# Patient Record
Sex: Female | Born: 1990 | Race: White | Hispanic: No | Marital: Single | Smoking: Never smoker
Health system: Southern US, Community
[De-identification: ages and names within clinical notes are randomized; demographics above are authoritative.]

## PROBLEM LIST (undated history)

## (undated) DIAGNOSIS — N83202 Unspecified ovarian cyst, left side: Secondary | ICD-10-CM

## (undated) DIAGNOSIS — K219 Gastro-esophageal reflux disease without esophagitis: Secondary | ICD-10-CM

## (undated) DIAGNOSIS — J189 Pneumonia, unspecified organism: Secondary | ICD-10-CM

## (undated) DIAGNOSIS — I10 Essential (primary) hypertension: Secondary | ICD-10-CM

## (undated) DIAGNOSIS — N83201 Unspecified ovarian cyst, right side: Secondary | ICD-10-CM

## (undated) DIAGNOSIS — M1990 Unspecified osteoarthritis, unspecified site: Secondary | ICD-10-CM

## (undated) HISTORY — PX: OTHER SURGICAL HISTORY: SHX169

---
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
## 2014-04-25 ENCOUNTER — Encounter (HOSPITAL_COMMUNITY): Payer: Self-pay

## 2014-04-25 ENCOUNTER — Emergency Department (HOSPITAL_COMMUNITY)
Admission: EM | Admit: 2014-04-25 | Discharge: 2014-04-26 | Disposition: A | Discharge status: Home / Self Care | Payer: 59 | Attending: Emergency Medicine | Admitting: Emergency Medicine

## 2014-04-25 DIAGNOSIS — N938 Other specified abnormal uterine and vaginal bleeding: Secondary | ICD-10-CM | POA: Diagnosis not present

## 2014-04-25 DIAGNOSIS — R109 Unspecified abdominal pain: Secondary | ICD-10-CM

## 2014-04-25 DIAGNOSIS — Z3202 Encounter for pregnancy test, result negative: Secondary | ICD-10-CM | POA: Insufficient documentation

## 2014-04-25 DIAGNOSIS — B9689 Other specified bacterial agents as the cause of diseases classified elsewhere: Secondary | ICD-10-CM

## 2014-04-25 DIAGNOSIS — N760 Acute vaginitis: Secondary | ICD-10-CM | POA: Diagnosis not present

## 2014-04-25 HISTORY — DX: Unspecified ovarian cyst, left side: N83.202

## 2014-04-25 HISTORY — DX: Unspecified ovarian cyst, right side: N83.201

## 2014-04-25 LAB — CBC WITH DIFFERENTIAL/PLATELET
Basophils Absolute: 0 10*3/uL (ref 0.0–0.1)
Basophils Relative: 0 % (ref 0–1)
Eosinophils Absolute: 0.1 10*3/uL (ref 0.0–0.7)
Eosinophils Relative: 1 % (ref 0–5)
HCT: 44.2 % (ref 36.0–46.0)
Hemoglobin: 15 g/dL (ref 12.0–15.0)
LYMPHS ABS: 3.4 10*3/uL (ref 0.7–4.0)
Lymphocytes Relative: 40 % (ref 12–46)
MCH: 30.3 pg (ref 26.0–34.0)
MCHC: 33.9 g/dL (ref 30.0–36.0)
MCV: 89.3 fL (ref 78.0–100.0)
Monocytes Absolute: 0.8 10*3/uL (ref 0.1–1.0)
Monocytes Relative: 9 % (ref 3–12)
NEUTROS ABS: 4.1 10*3/uL (ref 1.7–7.7)
Neutrophils Relative %: 50 % (ref 43–77)
Platelets: 286 10*3/uL (ref 150–400)
RBC: 4.95 MIL/uL (ref 3.87–5.11)
RDW: 12.4 % (ref 11.5–15.5)
WBC: 8.4 10*3/uL (ref 4.0–10.5)

## 2014-04-25 LAB — URINALYSIS, ROUTINE W REFLEX MICROSCOPIC
Bilirubin Urine: NEGATIVE
GLUCOSE, UA: NEGATIVE mg/dL
KETONES UR: NEGATIVE mg/dL
Leukocytes, UA: NEGATIVE
Nitrite: NEGATIVE
PH: 7 (ref 5.0–8.0)
PROTEIN: NEGATIVE mg/dL
Specific Gravity, Urine: 1.01 (ref 1.005–1.030)
Urobilinogen, UA: 0.2 mg/dL (ref 0.0–1.0)

## 2014-04-25 LAB — URINE MICROSCOPIC-ADD ON

## 2014-04-25 LAB — PREGNANCY, URINE: PREG TEST UR: NEGATIVE

## 2014-04-25 NOTE — ED Provider Notes (Signed)
CSN: [id number]     Arrival date & time 04/25/14  2144 History   First MD Initiated Contact with Patient 04/25/14 2300     Chief Complaint  Patient presents with  . Abdominal Pain     (Consider location/radiation/quality/duration/timing/severity/associated sxs/prior Treatment) HPI  Patient reports she's been having pain in her right flank and right mid abdomen off and on for the past 2 weeks. When she gets the pain it will last several days and go away. She describes the pain as sharp. She denies nausea, vomiting, diarrhea, or tissue area. She does have frequency but denies fever. She states movement makes the pain worse. She states laying down makes the pain feel better. She also reports her last normal period was about 2 weeks ago however she started bleeding about 5 days ago. She is on birth control pills to control ovarian cysts. She denies being sexually active.  PCP Dr Lysbeth Galas  Past Medical History  Diagnosis Date  . Bilateral ovarian cysts    History reviewed. No pertinent past surgical history. History reviewed. No pertinent family history. History  Substance Use Topics  . Smoking status: Never Smoker   . Smokeless tobacco: Not on file  . Alcohol Use: Yes     Comment: occasionally    employed  OB History    No data available     Review of Systems  All other systems reviewed and are negative.     Allergies  Review of patient's allergies indicates no known allergies.  Home Medications   Prior to Admission medications   Medication Sig Start Date End Date Taking? Authorizing Provider  cephALEXin (KEFLEX) 500 MG capsule Take 1 capsule (500 mg total) by mouth 3 (three) times daily. 04/26/14   Devoria Albe, MD  metroNIDAZOLE (FLAGYL) 500 MG tablet Take 1 tablet (500 mg total) by mouth 2 (two) times daily. 04/26/14   Devoria Albe, MD  naproxen (NAPROSYN) 500 MG tablet Take 1 po BID with food prn pain 04/26/14   Devoria Albe, MD   BP 151/100 mmHg  Pulse 62  Temp(Src) 99.2 F (37.3  C) (Oral)  Resp 22  Ht  (1.727 m)  Wt 180 lb (81.647 kg)  BMI 27.38 kg/m2  SpO2 100%  LMP  (LMP Unknown)  Vital signs normal   Physical Exam  Constitutional: She is oriented to person, place, and time. She appears well-developed and well-nourished.  Non-toxic appearance. She does not appear ill. No distress.  HENT:  Head: Normocephalic and atraumatic.  Right Ear: External ear normal.  Left Ear: External ear normal.  Nose: Nose normal. No mucosal edema or rhinorrhea.  Mouth/Throat: Oropharynx is clear and moist and mucous membranes are normal. No dental abscesses or uvula swelling.  Eyes: Conjunctivae and EOM are normal. Pupils are equal, round, and reactive to light.  Neck: Normal range of motion and full passive range of motion without pain. Neck supple.  Cardiovascular: Normal rate, regular rhythm and normal heart sounds.  Exam reveals no gallop and no friction rub.   No murmur heard. Pulmonary/Chest: Effort normal and breath sounds normal. No respiratory distress. She has no wheezes. She has no rhonchi. She has no rales. She exhibits no tenderness and no crepitus.  Abdominal: Soft. Normal appearance and bowel sounds are normal. She exhibits no distension. There is tenderness. There is no rebound and no guarding.    Area of pain noted  Genitourinary:  No CVAT  There is no active bleeding in the vaginal vault. There  is a small amount of white/brown discharge seen. Her uterus is normal size and nontender, her adnexa are normal size without masses and are nontender.  Musculoskeletal: Normal range of motion. She exhibits no edema or tenderness.       Back:  Moves all extremities well.  Area of pain noted, but no CVAT  Neurological: She is alert and oriented to person, place, and time. She has normal strength. No cranial nerve deficit.  Skin: Skin is warm, dry and intact. No rash noted. No erythema. No pallor.  Psychiatric: She has a normal mood and affect. Her speech is  normal and behavior is normal. Her mood appears not anxious.  Nursing note and vitals reviewed.   ED Course  Procedures (including critical care time) Medications - No data to display  We discussed patient's wet prep results. Patient was given results of her CT scan. Due to the possibility of pyelonephritis or cystitis seen on her CT scan without having a significant changes in her urine she was given a additional antibiotic, her BV will be treated with Flagyl and her urinary tract infection be treated with Keflex. [facility].   Labs Review Results for orders placed or performed during the hospital encounter of 04/25/14  Wet prep, genital  Result Value Ref Range   Yeast Wet Prep HPF POC NONE SEEN NONE SEEN   Trich, Wet Prep NONE SEEN NONE SEEN   Clue Cells Wet Prep HPF POC FEW (A) NONE SEEN   WBC, Wet Prep HPF POC FEW (A) NONE SEEN  Urinalysis, Routine w reflex microscopic  Result Value Ref Range   Color, Urine YELLOW YELLOW   APPearance HAZY (A) CLEAR   Specific Gravity, Urine 1.010 1.005 - 1.030   pH 7.0 5.0 - 8.0   Glucose, UA NEGATIVE NEGATIVE mg/dL   Hgb urine dipstick LARGE (A) NEGATIVE   Bilirubin Urine NEGATIVE NEGATIVE   Ketones, ur NEGATIVE NEGATIVE mg/dL   Protein, ur NEGATIVE NEGATIVE mg/dL   Urobilinogen, UA 0.2 0.0 - 1.0 mg/dL   Nitrite NEGATIVE NEGATIVE   Leukocytes, UA NEGATIVE NEGATIVE  Pregnancy, urine  Result Value Ref Range   Preg Test, Ur NEGATIVE NEGATIVE  CBC with Differential  Result Value Ref Range   WBC 8.4 4.0 - 10.5 K/uL   RBC 4.95 3.87 - 5.11 MIL/uL   Hemoglobin 15.0 12.0 - 15.0 g/dL   HCT 13.0 86.5 - 78.4 %   MCV 89.3 78.0 - 100.0 fL   MCH 30.3 26.0 - 34.0 pg   MCHC 33.9 30.0 - 36.0 g/dL   RDW 69.6 29.5 - 28.4 %   Platelets 286 150 - 400 K/uL   Neutrophils Relative % 50 43 - 77 %   Neutro Abs 4.1 1.7 - 7.7 K/uL   Lymphocytes Relative 40 12 - 46 %   Lymphs Abs 3.4 0.7 -  4.0 K/uL   Monocytes Relative 9 3 - 12 %   Monocytes Absolute 0.8 0.1 - 1.0 K/uL   Eosinophils Relative 1 0 - 5 %   Eosinophils Absolute 0.1 0.0 - 0.7 K/uL   Basophils Relative 0 0 - 1 %   Basophils Absolute 0.0 0.0 - 0.1 K/uL  Comprehensive metabolic panel  Result Value Ref Range   Sodium 138 135 - 145 mmol/L   Potassium 3.2 (L) 3.5 - 5.1 mmol/L   Chloride 105 96 - 112 mmol/L   CO2 24 19 - 32 mmol/L  Glucose, Bld 96 70 - 99 mg/dL   BUN 9 6 - 23 mg/dL   Creatinine, Ser 4.09 0.50 - 1.10 mg/dL   Calcium 9.0 8.4 - 81.1 mg/dL   Total Protein 8.2 6.0 - 8.3 g/dL   Albumin 4.3 3.5 - 5.2 g/dL   AST 23 0 - 37 U/L   ALT 27 0 - 35 U/L   Alkaline Phosphatase 72 39 - 117 U/L   Total Bilirubin 0.3 0.3 - 1.2 mg/dL   GFR calc non Af Amer >90 >90 mL/min   GFR calc Af Amer >90 >90 mL/min   Anion gap 9 5 - 15  Urinalysis, Routine w reflex microscopic  Result Value Ref Range   Color, Urine YELLOW YELLOW   APPearance CLEAR CLEAR   Specific Gravity, Urine 1.010 1.005 - 1.030   pH 7.5 5.0 - 8.0   Glucose, UA NEGATIVE NEGATIVE mg/dL   Hgb urine dipstick NEGATIVE NEGATIVE   Bilirubin Urine NEGATIVE NEGATIVE   Ketones, ur NEGATIVE NEGATIVE mg/dL   Protein, ur NEGATIVE NEGATIVE mg/dL   Urobilinogen, UA 0.2 0.0 - 1.0 mg/dL   Nitrite NEGATIVE NEGATIVE   Leukocytes, UA NEGATIVE NEGATIVE  Urine microscopic-add on  Result Value Ref Range   Squamous Epithelial / LPF FEW (A) RARE   WBC, UA 0-2 <3 WBC/hpf   RBC / HPF 3-6 <3 RBC/hpf   Bacteria, UA FEW (A) RARE   Laboratory interpretation all normal except hematuria (voided urine while on menses), bv     Imaging Review Ct Renal Stone Study  04/26/2014   CLINICAL DATA:  Sharp intermittent right flank and side pain for 2 weeks. Vaginal bleeding for 5 days. History of ovarian cysts.  EXAM: CT ABDOMEN AND PELVIS WITHOUT CONTRAST  TECHNIQUE: Multidetector CT imaging of the abdomen and pelvis was performed following the standard protocol without IV  contrast.  COMPARISON:  01/29/2013  FINDINGS: The lung bases are clear.  The right kidney demonstrates heterogeneous appearance of the mid and lower pole with loss of renal sinus fat. This is difficult to characterize in the absence of IV contrast material. Appearance could be due to focal pyelonephritis or mass lesion. Correlation is recommended for urinary tract infection. Consider followup after resolution of acute process to exclude underlying renal lesion. No renal, ureteral, or bladder stones identified. No evidence of hydronephrosis or hydroureter. The bladder wall appears mildly thickened and there is gas within the bladder. In the absence of instrumentation, this suggests infection.  The unenhanced appearance of the liver, spleen, gallbladder, pancreas, adrenal glands, kidneys, abdominal aorta, inferior vena cava, and retroperitoneal lymph nodes is unremarkable. Stomach, small bowel, and colon are decompressed. No free air or free fluid in the abdomen.  Pelvis: Uterus and ovaries are not enlarged. No free or loculated pelvic fluid collections. No pelvic mass or lymphadenopathy. The appendix is not identified. No destructive bone lesions.  IMPRESSION: No renal or ureteral stone or obstruction. Bladder wall thickening and gas in the bladder suggest cystitis. Nonspecific heterogeneous appearance of the right kidney is difficult to characterize without IV contrast material. This may represent pyelonephritis particularly in the setting of cystitis. However, underlying mass lesion is not excluded and followup after resolution of acute process is suggested.   Electronically Signed   By: Burman Nieves M.D.   On: 04/26/2014 03:50     EKG Interpretation None      MDM   Final diagnoses:  Right flank pain  DUB (dysfunctional uterine bleeding)  BV (bacterial vaginosis)  Discharge Medication List as of 04/26/2014  4:20 AM    START taking these medications   Details  cephALEXin (KEFLEX) 500 MG  capsule Take 1 capsule (500 mg total) by mouth 3 (three) times daily., Starting 04/26/2014, Until Discontinued, Print    metroNIDAZOLE (FLAGYL) 500 MG tablet Take 1 tablet (500 mg total) by mouth 2 (two) times daily., Starting 04/26/2014, Until Discontinued, Print    naproxen (NAPROSYN) 500 MG tablet Take 1 po BID with food prn pain, Print        Plan discharge  Devoria AlbeIva Terria Deschepper, MD, [facility]oFACEP     Tee Richeson, MD 04/26/14 [phone]

## 2014-04-25 NOTE — ED Notes (Signed)
Patient states right flank/side pain X2 weeks with vaginal bleeding X5 days. Patient has a history of ovarian cysts

## 2014-04-26 ENCOUNTER — Emergency Department (HOSPITAL_COMMUNITY): Payer: 59

## 2014-04-26 LAB — COMPREHENSIVE METABOLIC PANEL
ALBUMIN: 4.3 g/dL (ref 3.5–5.2)
ALK PHOS: 72 U/L (ref 39–117)
ALT: 27 U/L (ref 0–35)
AST: 23 U/L (ref 0–37)
Anion gap: 9 (ref 5–15)
BUN: 9 mg/dL (ref 6–23)
CO2: 24 mmol/L (ref 19–32)
Calcium: 9 mg/dL (ref 8.4–10.5)
Chloride: 105 mmol/L (ref 96–112)
Creatinine, Ser: 0.7 mg/dL (ref 0.50–1.10)
GFR calc Af Amer: >90 mL/min (ref >90)
GFR calc non Af Amer: >90 mL/min (ref >90)
Glucose, Bld: 96 mg/dL (ref 70–99)
POTASSIUM: 3.2 mmol/L — AB (ref 3.5–5.1)
Sodium: 138 mmol/L (ref 135–145)
TOTAL PROTEIN: 8.2 g/dL (ref 6.0–8.3)
Total Bilirubin: 0.3 mg/dL (ref 0.3–1.2)

## 2014-04-26 LAB — URINALYSIS, ROUTINE W REFLEX MICROSCOPIC
BILIRUBIN URINE: NEGATIVE
GLUCOSE, UA: NEGATIVE mg/dL
HGB URINE DIPSTICK: NEGATIVE
Ketones, ur: NEGATIVE mg/dL
Leukocytes, UA: NEGATIVE
Nitrite: NEGATIVE
PH: 7.5 (ref 5.0–8.0)
Protein, ur: NEGATIVE mg/dL
Specific Gravity, Urine: 1.01 (ref 1.005–1.030)
Urobilinogen, UA: 0.2 mg/dL (ref 0.0–1.0)

## 2014-04-26 LAB — WET PREP, GENITAL
Trich, Wet Prep: NONE SEEN
YEAST WET PREP: NONE SEEN

## 2014-04-26 MED ORDER — NAPROXEN 500 MG PO TABS: ORAL_TABLET | ORAL | Status: DC

## 2014-04-26 MED ORDER — CEPHALEXIN 500 MG PO CAPS: 500.0000 mg | ORAL_CAPSULE | Freq: Three times a day (TID) | ORAL | Status: DC

## 2014-04-26 MED ORDER — METRONIDAZOLE 500 MG PO TABS: 500.0000 mg | ORAL_TABLET | Freq: Two times a day (BID) | ORAL | Status: DC

## 2014-04-26 NOTE — Discharge Instructions (Signed)
Drink plenty of fluids. Take the medications as prescribed. [facility] bleeding if it doesn't stop. You can take Aleve 2 tabs OTC twice a day for pain OR ibuprofen 600 mg 4 times a day for pain.   Return to the ED if you get a fever, have uncontrolled vomiting or severe pain.     Bacterial Vaginosis Bacterial vaginosis is a vaginal infection that occurs when the normal balance of bacteria in the vagina is disrupted. It results from an overgrowth of certain bacteria. This is the most common vaginal infection in women of childbearing age. Treatment is important to prevent complications, especially in pregnant women, as it can cause a premature delivery. CAUSES  Bacterial vaginosis is caused by an increase in harmful bacteria that are normally present in smaller amounts in the vagina. Several different kinds of bacteria can cause bacterial vaginosis. However, the reason that the condition develops is not fully understood. RISK FACTORS Certain activities or behaviors can put you at an increased risk of developing bacterial vaginosis, including:  Having a new sex partner or multiple sex partners.  Douching.  Using an intrauterine device (IUD) for contraception. Women do not get bacterial vaginosis from toilet seats, bedding, swimming pools, or contact with objects around them. SIGNS AND SYMPTOMS  Some women with bacterial vaginosis have no signs or symptoms. Common symptoms include:  Grey vaginal discharge.  A fishlike odor with discharge, especially after sexual intercourse.  Itching or burning of the vagina and vulva.  Burning or pain with urination. DIAGNOSIS  Your health care provider will take a medical history and examine the vagina for signs of bacterial vaginosis. A sample of vaginal fluid may be taken. Your health care provider will look at this sample under a microscope to check for bacteria and abnormal cells. A vaginal pH test  may also be done.  TREATMENT  Bacterial vaginosis may be treated with antibiotic medicines. These may be given in the form of a pill or a vaginal cream. A second round of antibiotics may be prescribed if the condition comes back after treatment.  HOME CARE INSTRUCTIONS   Only take over-the-counter or prescription medicines as directed by your health care provider.  If antibiotic medicine was prescribed, take it as directed. Make sure you finish it even if you start to feel better.  Do not have sex until treatment is completed.  Tell all sexual partners that you have a vaginal infection. They should see their health care provider and be treated if they have problems, such as a mild rash or itching.  Practice safe sex by using condoms and only having one sex partner. SEEK MEDICAL CARE IF:   Your symptoms are not improving after 3 days of treatment.  You have increased discharge or pain.  You have a fever. MAKE SURE YOU:   Understand these instructions.  Will watch your condition.  Will get help right away if you are not doing well or get worse. FOR MORE INFORMATION  Centers for Disease Control and Prevention, Division of STD Prevention: SolutionApps.co.zawww.cdc.gov/std American Sexual Health Association (ASHA): www.ashastd.org  Document Released: 01/08/2005 Document Revised: 10/29/2012 Document Reviewed: 08/20/2012 [facility] Patient Information 2015 [place], [place]. This information is not intended to replace advice given to you by your health care provider. Make sure you discuss any questions you have with your health care provider.

## 2014-04-27 LAB — URINE CULTURE
Colony Count: NO GROWTH
Culture: NO GROWTH

## 2014-04-27 LAB — GC/CHLAMYDIA PROBE AMP (~~LOC~~) NOT AT ARMC
Chlamydia: NEGATIVE
NEISSERIA GONORRHEA: NEGATIVE

## 2014-04-27 LAB — RPR: RPR Ser Ql: NONREACTIVE

## 2016-03-23 IMAGING — CT CT RENAL STONE PROTOCOL
3 of 4 series · 10 of 46 positions shown, 15 images · non-contrast
Comparison: 01/29/2013

CLINICAL DATA: Sharp intermittent right flank and side pain for 2
weeks. Vaginal bleeding for 5 days. History of ovarian cysts.

EXAM:
CT ABDOMEN AND PELVIS WITHOUT CONTRAST
TECHNIQUE: Multidetector CT imaging of the abdomen and pelvis was performed
following the standard protocol without IV contrast.

[Series 2: mpr coronal (id) · coronal · 0.64mm/px · 3 of 90 slices shown]
[im 30/90  soft-tissue]
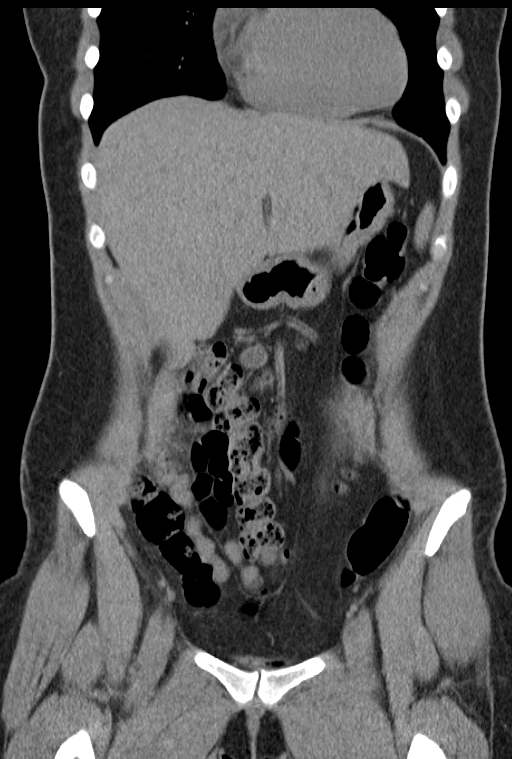
[im 40/90  soft-tissue]
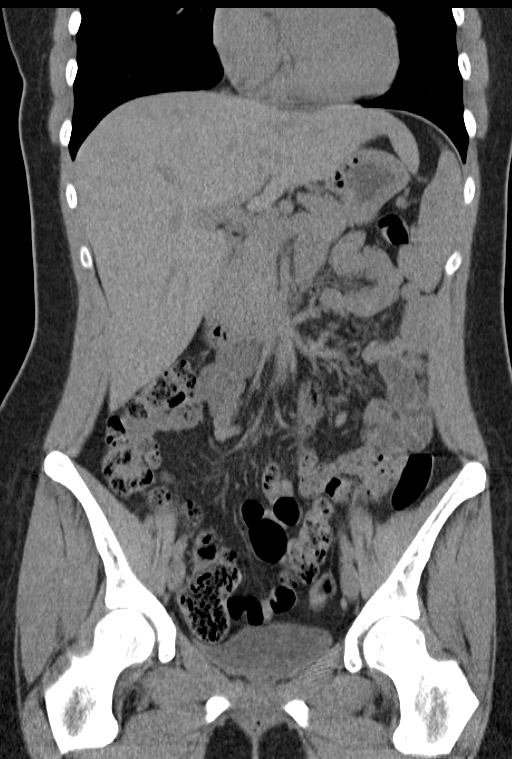
[im 50/90  soft-tissue]
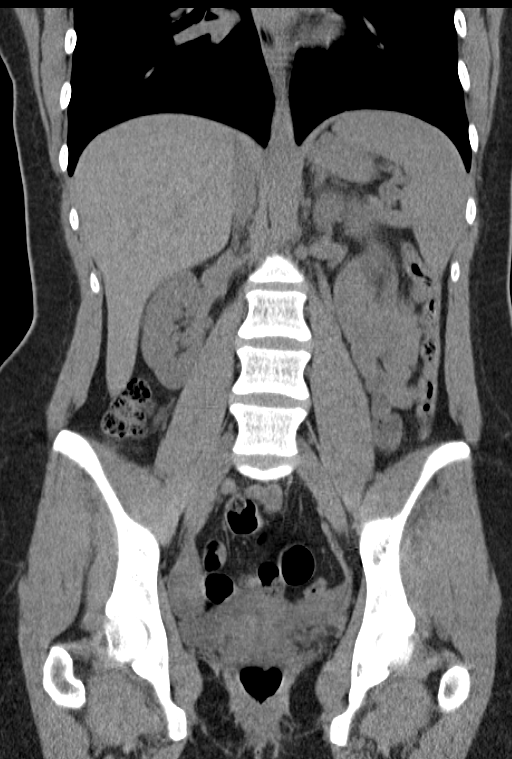

[Series 4: mpr sagittal (id) · sagittal · 0.62mm/px · 1 of 110 slices shown]
[im 37/110  soft-tissue]
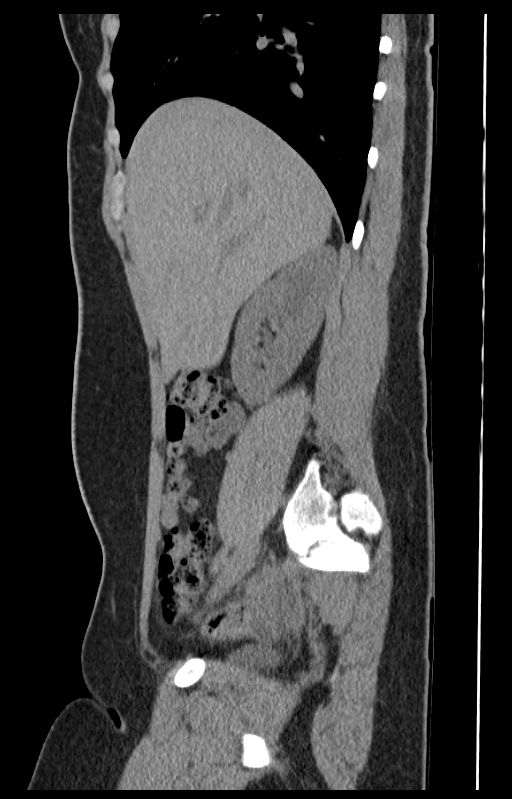

[Series 6: lung 5.0 b60f · axial · 0.59mm/px · z∈[-576,-462]mm · 6 of 33 slices shown, 11 images]
[im 5/33  soft-tissue]
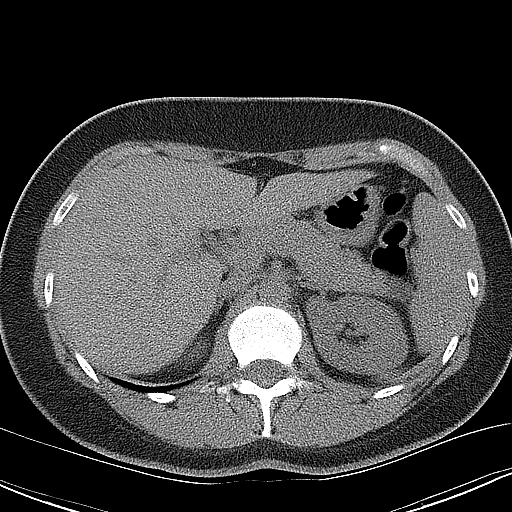
[im 5/33  bone]
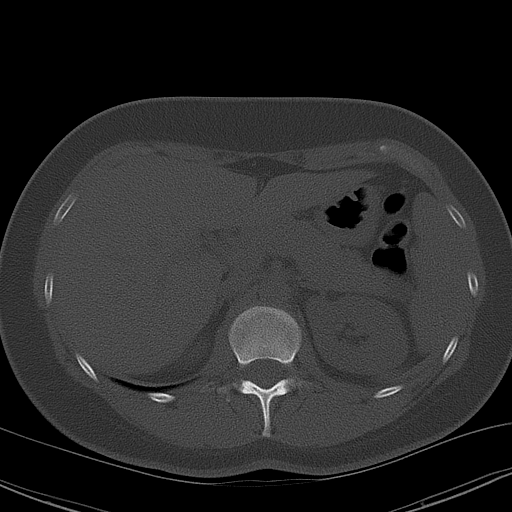
[im 10/33  soft-tissue]
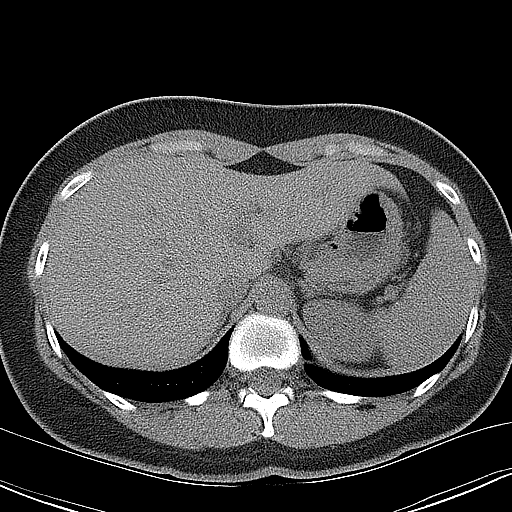
[im 14/33  soft-tissue]
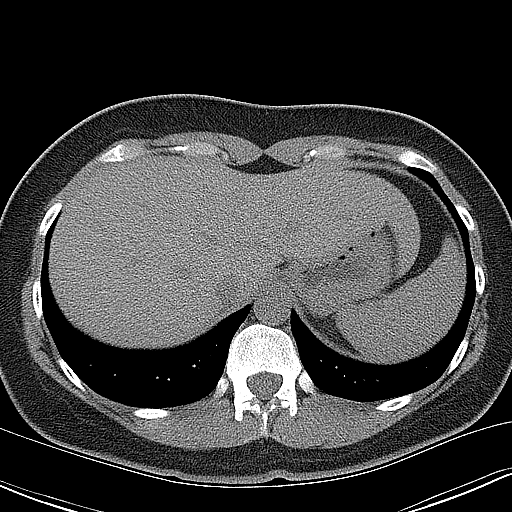
[im 14/33  lung]
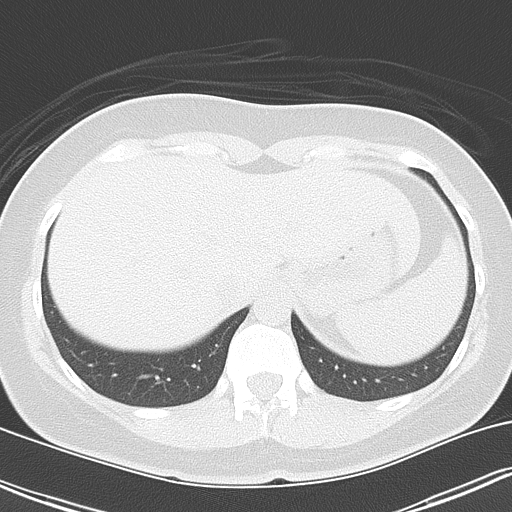
[im 19/33  soft-tissue]
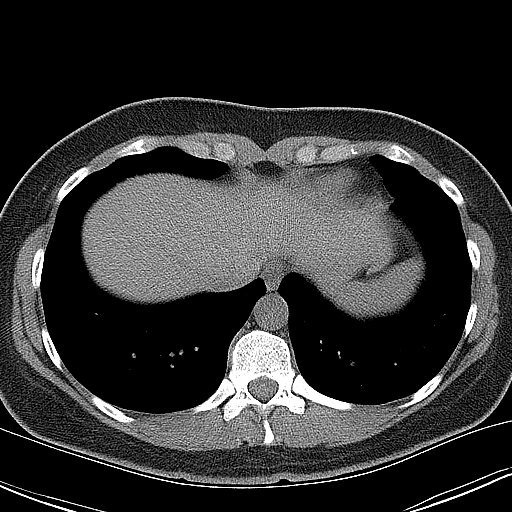
[im 19/33  lung]
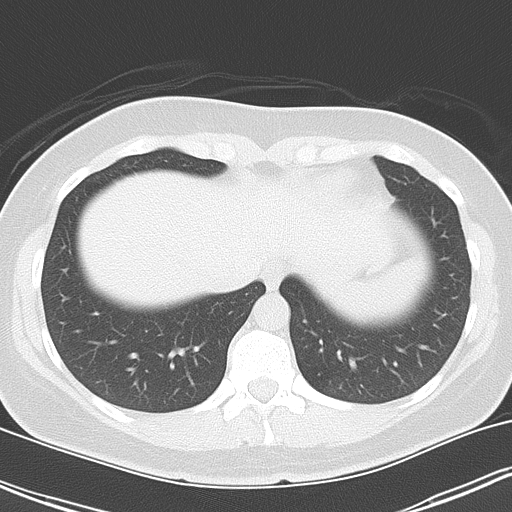
[im 23/33  soft-tissue]
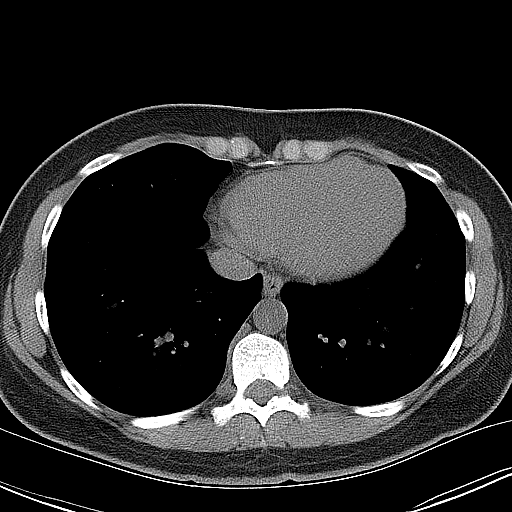
[im 23/33  lung]
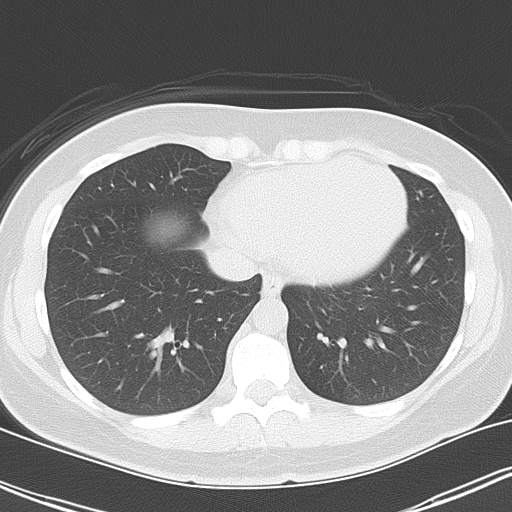
[im 28/33  soft-tissue]
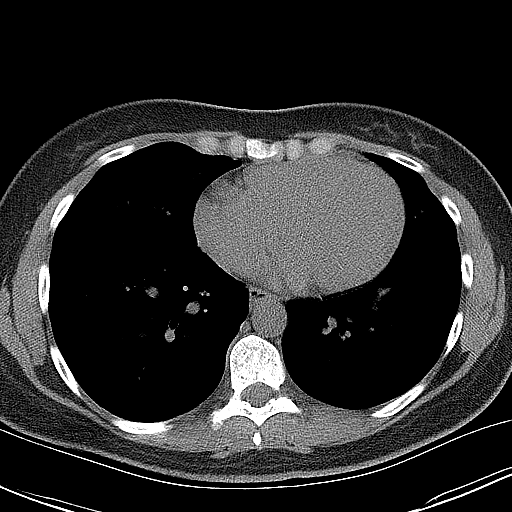
[im 28/33  lung]
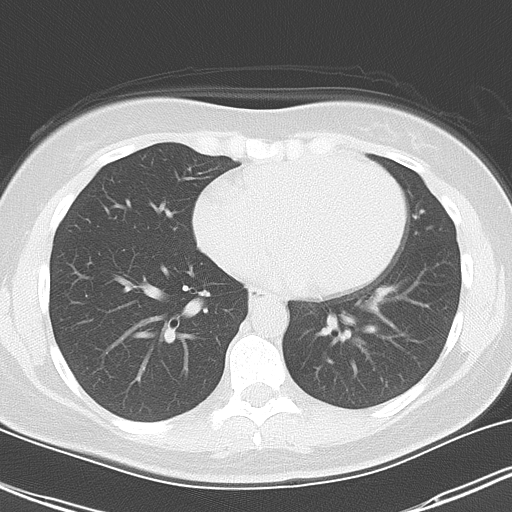

[10 of 46 positions shown; findings below may reference images not displayed]

FINDINGS: The lung bases are clear.

The right kidney demonstrates heterogeneous appearance of the mid
and lower pole with loss of renal sinus fat. This is difficult to
characterize in the absence of IV contrast material. Appearance
could be due to focal pyelonephritis or mass lesion. Correlation is
recommended for urinary tract infection. Consider followup after
resolution of acute process to exclude underlying renal lesion. No
renal, ureteral, or bladder stones identified. No evidence of
hydronephrosis or hydroureter. The bladder wall appears mildly
thickened and there is gas within the bladder. In the absence of
instrumentation, this suggests infection.

The unenhanced appearance of the liver, spleen, gallbladder,
pancreas, adrenal glands, kidneys, abdominal aorta, inferior vena
cava, and retroperitoneal lymph nodes is unremarkable. Stomach,
small bowel, and colon are decompressed. No free air or free fluid
in the abdomen.

Pelvis: Uterus and ovaries are not enlarged. No free or loculated
pelvic fluid collections. No pelvic mass or lymphadenopathy. The
appendix is not identified. No destructive bone lesions.
IMPRESSION: No renal or ureteral stone or obstruction. Bladder wall thickening
and gas in the bladder suggest cystitis. Nonspecific heterogeneous
appearance of the right kidney is difficult to characterize without
IV contrast material. This may represent pyelonephritis particularly
in the setting of cystitis. However, underlying mass lesion is not
excluded and followup after resolution of acute process is
suggested.

## 2023-04-02 ENCOUNTER — Ambulatory Visit: Payer: Self-pay | Admitting: Surgery

## 2023-04-02 NOTE — H&P (Signed)
 Michelle Pineda [id number]   Referring Provider: Rebecka Apley, NP  Subjective   Chief Complaint: New Consultation   History of Present Illness: 33 year old woman history of ovarian cysts, IBS, hypertension, GERD, lumbar radiculopathy, scapula pain and cervicalgia who was referred for a subcutaneous cyst of the scalp. This has been treated with Keflex and ketoconazole shampoo per PCP notes from 2 weeks ago is about 3 mm in size. Patient notes there are 3 lesions, 1 of which seems to have grown somewhat in size and is becoming more tender. No prior procedures in the area and has not noted any drainage.  Review of Systems: A complete review of systems was obtained from the patient. I have reviewed this information and discussed as appropriate with the patient. See HPI as well for other ROS.  Medical History: Past Medical History:  Diagnosis Date  Arthritis  GERD (gastroesophageal reflux disease)  Hypertension   There is no problem list on file for this patient.  History reviewed. No pertinent surgical history.   No Known Allergies  Current Outpatient Medications on File Prior to Visit  Medication Sig Dispense Refill  amLODIPine (NORVASC) 10 MG tablet Take 10 mg by mouth once daily  gabapentin (NEURONTIN) 300 MG capsule 1 PM x 1week then, 2 PM x 1 week then, 1 AM + 2PM x 1week, then 2 AM + 2PM  loratadine (CLARITIN) 10 mg tablet Take 10 mg by mouth once daily  losartan (COZAAR) 50 MG tablet Take 50 mg by mouth once daily  meloxicam (MOBIC) 15 MG tablet Take 15 mg by mouth once daily  methocarbamoL (ROBAXIN) 500 MG tablet Take 500 mg by mouth  montelukast (SINGULAIR) 10 mg tablet Take 10 mg by mouth at bedtime  naproxen sodium (ALEVE) 220 mg Cap Take 220 mg by mouth every 12 (twelve) hours   No current facility-administered medications on file prior to visit.   Family History  Problem Relation Age of Onset  Deep vein thrombosis (DVT or abnormal blood clot formation)  Mother  Stroke Father  Diabetes Father    Social History   Tobacco Use  Smoking Status Never  Smokeless Tobacco Never    Social History   Socioeconomic History  Marital status: Single  Tobacco Use  Smoking status: Never  Smokeless tobacco: Never  Substance and Sexual Activity  Alcohol use: Never  Drug use: Never   Social Drivers of Corporate investment banker Strain: Medium Risk (02/19/2023)  Received from [facility]  Overall Financial Resource Strain (CARDIA)  Difficulty of Paying Living Expenses: Somewhat hard  Food Insecurity: No Food Insecurity (02/19/2023)  Received from [facility]  Hunger Vital Sign  Worried About Running Out of Food in the Last Year: Never true  Ran Out of Food in the Last Year: Never true  Transportation Needs: No Transportation Needs (02/19/2023)  [facility] - [facility]  Lack of Transportation (Medical): No  Lack of Transportation (Non-Medical): No  Physical Activity: Sufficiently Active (02/19/2023)  Received from [facility]  Exercise Vital Sign  Days of Exercise per Week: 7 days  Minutes of Exercise per Session: 150+ min  Stress: No Stress Concern Present (02/19/2023)  [facility] from [facility] of Occupational [facility] - Occupational Stress Questionnaire  Feeling of Stress : Not at all  Social Connections: Moderately Integrated (02/19/2023)  Received from [facility]  Social Network  How would you rate your social network (family, work, friends)?: Adequate participation with social networks  Housing Stability: Unknown (03/29/2023)  Housing Stability Vital Sign  Homeless in the Last Year: No   Objective:   Vitals:  03/29/23 1052  BP: 122/80  Pulse: 97  Temp: 36.9 C (98.5 F)  SpO2: 99%  Weight: (!) 115.9 kg (255 lb 9.6 oz)  Height: 172.7 cm (5\' 8" )  PainSc: 0-No pain  PainLoc: Head - Temporal   Body mass index is 38.86 kg/m.  Gen: A&Ox3, no distress  Unlabored  respirations 3 small subcutaneous cysts located on the occiput. To the right of midline is an approximately 8 mm smooth mobile subcutaneous cyst without overlying skin change. To the left of midline is a similar lesion about 3 mm in size, and then just inferior to this 1 is another 3 mm cyst.  Assessment and Plan:  Diagnoses and all orders for this visit:  Subcutaneous mass  Patient desires excision. We discussed the procedure including risks of bleeding, infection, pain, scarring, poor wound healing or undesired cosmetic result, recurrence of the lesions or development of other lesions in other parts of the scalp. Questions welcomed and answered to her satisfaction. She would like to investigate cost prior to scheduling but is interested in proceeding with removal.  [name], MD

## 2023-04-02 NOTE — H&P (View-Only) (Signed)
 Michelle Pineda [id number]   Referring Provider: Rebecka Apley, NP  Subjective   Chief Complaint: New Consultation   History of Present Illness: 33 year old woman history of ovarian cysts, IBS, hypertension, GERD, lumbar radiculopathy, scapula pain and cervicalgia who was referred for a subcutaneous cyst of the scalp. This has been treated with Keflex and ketoconazole shampoo per PCP notes from 2 weeks ago is about 3 mm in size. Patient notes there are 3 lesions, 1 of which seems to have grown somewhat in size and is becoming more tender. No prior procedures in the area and has not noted any drainage.  Review of Systems: A complete review of systems was obtained from the patient. I have reviewed this information and discussed as appropriate with the patient. See HPI as well for other ROS.  Medical History: Past Medical History:  Diagnosis Date  Arthritis  GERD (gastroesophageal reflux disease)  Hypertension   There is no problem list on file for this patient.  History reviewed. No pertinent surgical history.   No Known Allergies  Current Outpatient Medications on File Prior to Visit  Medication Sig Dispense Refill  amLODIPine (NORVASC) 10 MG tablet Take 10 mg by mouth once daily  gabapentin (NEURONTIN) 300 MG capsule 1 PM x 1week then, 2 PM x 1 week then, 1 AM + 2PM x 1week, then 2 AM + 2PM  loratadine (CLARITIN) 10 mg tablet Take 10 mg by mouth once daily  losartan (COZAAR) 50 MG tablet Take 50 mg by mouth once daily  meloxicam (MOBIC) 15 MG tablet Take 15 mg by mouth once daily  methocarbamoL (ROBAXIN) 500 MG tablet Take 500 mg by mouth  montelukast (SINGULAIR) 10 mg tablet Take 10 mg by mouth at bedtime  naproxen sodium (ALEVE) 220 mg Cap Take 220 mg by mouth every 12 (twelve) hours   No current facility-administered medications on file prior to visit.   Family History  Problem Relation Age of Onset  Deep vein thrombosis (DVT or abnormal blood clot formation)  Mother  Stroke Father  Diabetes Father    Social History   Tobacco Use  Smoking Status Never  Smokeless Tobacco Never    Social History   Socioeconomic History  Marital status: Single  Tobacco Use  Smoking status: Never  Smokeless tobacco: Never  Substance and Sexual Activity  Alcohol use: Never  Drug use: Never   Social Drivers of Corporate investment banker Strain: Medium Risk (02/19/2023)  Received from [facility]  Overall Financial Resource Strain (CARDIA)  Difficulty of Paying Living Expenses: Somewhat hard  Food Insecurity: No Food Insecurity (02/19/2023)  Received from [facility]  Hunger Vital Sign  Worried About Running Out of Food in the Last Year: Never true  Ran Out of Food in the Last Year: Never true  Transportation Needs: No Transportation Needs (02/19/2023)  Received from [facility] - Transportation  Lack of Transportation (Medical): No  Lack of Transportation (Non-Medical): No  Physical Activity: Sufficiently Active (02/19/2023)  [facility]  Exercise Vital Sign  Days of Exercise per Week: 7 days  Minutes of Exercise per Session: 150+ min  Stress: No Stress Concern Present (02/19/2023)  Received from [facility] of Occupational Health - Occupational Stress Questionnaire  Feeling of Stress : Not at all  Social Connections: Moderately Integrated (02/19/2023)  Received from [facility]  Social Network  How would you rate your social network (family, work, friends)?: Adequate participation with social networks  Housing Stability: Unknown (03/29/2023)  Housing Stability Vital Sign  Homeless in the Last Year: No   Objective:   Vitals:  03/29/23 1052  BP: 122/80  Pulse: 97  Temp: 36.9 C (98.5 F)  SpO2: 99%  Weight: (!) 115.9 kg (255 lb 9.6 oz)  Height: 172.7 cm (5\' 8" )  PainSc: 0-No pain  PainLoc: Head - Temporal   Body mass index is 38.86 kg/m.  Gen: A&Ox3, no distress  Unlabored  respirations 3 small subcutaneous cysts located on the occiput. To the right of midline is an approximately 8 mm smooth mobile subcutaneous cyst without overlying skin change. To the left of midline is a similar lesion about 3 mm in size, and then just inferior to this 1 is another 3 mm cyst.  Assessment and Plan:  Diagnoses and all orders for this visit:  Subcutaneous mass  Patient desires excision. We discussed the procedure including risks of bleeding, infection, pain, scarring, poor wound healing or undesired cosmetic result, recurrence of the lesions or development of other lesions in other parts of the scalp. Questions welcomed and answered to her satisfaction. She would like to investigate cost prior to scheduling but is interested in proceeding with removal.  [name], MD

## 2023-04-08 NOTE — Patient Instructions (Addendum)
 SURGICAL WAITING ROOM VISITATION  Patients having surgery or a procedure may have no more than 2 support people in the waiting area - these visitors may rotate.    Children under the age of 69 must have an adult with them who is not the patient.  Due to an increase in RSV and influenza rates and associated hospitalizations, children ages 37 and under may not visit patients in [facility] hospitals.  Visitors with respiratory illnesses are discouraged from visiting and should remain at home.  If the patient needs to stay at the hospital during part of their recovery, the visitor guidelines for inpatient rooms apply. Pre-op nurse will coordinate an appropriate time for 1 support person to accompany patient in pre-op.  This support person may not rotate.    Please refer to the [facility] website for the visitor guidelines for Inpatients (after your surgery is over and you are in a regular room).       Your procedure is scheduled on:  04/12/2023    Report to [facility] Main Entrance    Report to admitting at  0830 AM   Call this number if you have problems the morning of surgery [phone]   Do not eat food  or drink liquids :After Midnight.                  If you have questions, please contact your surgeon's office.      Oral Hygiene is also important to reduce your risk of infection.                                    Remember - BRUSH YOUR TEETH THE MORNING OF SURGERY WITH YOUR REGULAR TOOTHPASTE  DENTURES WILL BE REMOVED PRIOR TO SURGERY PLEASE DO NOT APPLY "Poly grip" OR ADHESIVES!!!   Do NOT smoke after Midnight   Stop all vitamins and herbal supplements 7 days before surgery.   Take these medicines the morning of surgery with A SIP OF WATER:  inhalers as usual and bring, amlodipinoe, claritin   DO NOT TAKE ANY ORAL DIABETIC MEDICATIONS DAY OF YOUR SURGERY  Bring CPAP mask and tubing day of surgery.                              You may not have any  metal on your body including hair pins, jewelry, and body piercing             Do not wear make-up, lotions, powders, perfumes/cologne, or deodorant  Do not wear nail polish including gel and S&S, artificial/acrylic nails, or any other type of covering on natural nails including finger and toenails. If you have artificial nails, gel coating, etc. that needs to be removed by a nail salon please have this removed prior to surgery or surgery may need to be canceled/ delayed if the surgeon/ anesthesia feels like they are unable to be safely monitored.   Do not shave  48 hours prior to surgery.               Men may shave face and neck.   Do not bring valuables to the hospital. [place] IS NOT             RESPONSIBLE   FOR VALUABLES.   Contacts, glasses, dentures or bridgework may not be worn into surgery.  Bring small overnight bag day of surgery.   DO NOT BRING YOUR HOME MEDICATIONS TO THE HOSPITAL. PHARMACY WILL DISPENSE MEDICATIONS LISTED ON YOUR MEDICATION LIST TO YOU DURING YOUR ADMISSION IN THE HOSPITAL!    Patients discharged on the day of surgery will not be allowed to drive home.  Someone NEEDS to stay with you for the first 24 hours after anesthesia.   Special Instructions: Bring a copy of your healthcare power of attorney and living will documents the day of surgery if you haven't scanned them before.              Please read over the following fact sheets you were given: IF YOU HAVE QUESTIONS ABOUT YOUR PRE-OP INSTRUCTIONS PLEASE CALL [phone]   If you received a COVID test during your pre-op visit  it is requested that you wear a mask when out in public, stay away from anyone that may not be feeling well and notify your surgeon if you develop symptoms. If you test positive for Covid or have been in contact with anyone that has tested positive in the last 10 days please notify you surgeon.    [place] - Preparing for Surgery Before surgery, you can play an important  role.  Because skin is not sterile, your skin needs to be as free of germs as possible.  You can reduce the number of germs on your skin by washing with CHG (chlorahexidine gluconate) soap before surgery.  CHG is an antiseptic cleaner which kills germs and bonds with the skin to continue killing germs even after washing. Please DO NOT use if you have an allergy to CHG or antibacterial soaps.  If your skin becomes reddened/irritated stop using the CHG and inform your nurse when you arrive at Short Stay. Do not shave (including legs and underarms) for at least 48 hours prior to the first CHG shower.  You may shave your face/neck. Please follow these instructions carefully:  1.  Shower with CHG Soap the night before surgery and the  morning of Surgery.  2.  If you choose to wash your hair, wash your hair first as usual with your  normal  shampoo.  3.  After you shampoo, rinse your hair and body thoroughly to remove the  shampoo.                           4.  Use CHG as you would any other liquid soap.  You can apply chg directly  to the skin and wash                       Gently with a scrungie or clean washcloth.  5.  Apply the CHG Soap to your body ONLY FROM THE NECK DOWN.   Do not use on face/ open                           Wound or open sores. Avoid contact with eyes, ears mouth and genitals (private parts).                       Wash face,  Genitals (private parts) with your normal soap.             6.  Wash thoroughly, paying special attention to the area where your surgery  will be performed.  7.  Thoroughly rinse your body  with warm water from the neck down.  8.  DO NOT shower/wash with your normal soap after using and rinsing off  the CHG Soap.                9.  Pat yourself dry with a clean towel.            10.  Wear clean pajamas.            11.  Place clean sheets on your bed the night of your first shower and do not  sleep with pets. Day of Surgery : Do not apply any lotions/deodorants  the morning of surgery.  Please wear clean clothes to the hospital/surgery center.  FAILURE TO FOLLOW THESE INSTRUCTIONS MAY RESULT IN THE CANCELLATION OF YOUR SURGERY PATIENT SIGNATURE_________________________________  NURSE SIGNATURE__________________________________  ________________________________________________________________________

## 2023-04-08 NOTE — Progress Notes (Signed)
 Anesthesia Review:  PCP: Cardiologist :  PPM/ ICD: Device Orders: Rep Notified:  Chest x-ray : EKG : Echo : Stress test: Cardiac Cath :   Activity level:  Sleep Study/ CPAP : Fasting Blood Sugar :      / Checks Blood Sugar -- times a day:    Blood Thinner/ Instructions /Last Dose: ASA / Instructions/ Last Dose :

## 2023-04-10 ENCOUNTER — Other Ambulatory Visit: Payer: Self-pay

## 2023-04-10 ENCOUNTER — Encounter (HOSPITAL_COMMUNITY): Payer: Self-pay

## 2023-04-10 ENCOUNTER — Encounter (HOSPITAL_COMMUNITY)
Admission: RE | Admit: 2023-04-10 | Discharge: 2023-04-10 | Disposition: A | Discharge status: Home / Self Care | Source: Ambulatory Visit | Attending: Family Medicine | Admitting: Family Medicine

## 2023-04-10 DIAGNOSIS — Z01818 Encounter for other preprocedural examination: Secondary | ICD-10-CM

## 2023-04-10 HISTORY — DX: Unspecified osteoarthritis, unspecified site: M19.90

## 2023-04-10 HISTORY — DX: Pneumonia, unspecified organism: J18.9

## 2023-04-10 HISTORY — DX: Gastro-esophageal reflux disease without esophagitis: K21.9

## 2023-04-10 HISTORY — DX: Essential (primary) hypertension: I10

## 2023-04-12 ENCOUNTER — Ambulatory Visit (HOSPITAL_COMMUNITY): Payer: Self-pay | Admitting: Physician Assistant

## 2023-04-12 ENCOUNTER — Other Ambulatory Visit: Payer: Self-pay

## 2023-04-12 ENCOUNTER — Ambulatory Visit (HOSPITAL_COMMUNITY): Admitting: Anesthesiology

## 2023-04-12 ENCOUNTER — Ambulatory Visit (HOSPITAL_COMMUNITY)
Admission: RE | Admit: 2023-04-12 | Discharge: 2023-04-12 | Disposition: A | Discharge status: Home / Self Care | Payer: Self-pay | Attending: Surgery | Admitting: Surgery

## 2023-04-12 ENCOUNTER — Encounter (HOSPITAL_COMMUNITY)
Admission: RE | Disposition: A | Discharge status: Home / Self Care | Payer: Self-pay | Source: Home / Self Care | Attending: Surgery

## 2023-04-12 ENCOUNTER — Encounter (HOSPITAL_COMMUNITY): Payer: Self-pay | Admitting: Surgery

## 2023-04-12 DIAGNOSIS — K589 Irritable bowel syndrome without diarrhea: Secondary | ICD-10-CM | POA: Insufficient documentation

## 2023-04-12 DIAGNOSIS — Z5986 Financial insecurity: Secondary | ICD-10-CM | POA: Insufficient documentation

## 2023-04-12 DIAGNOSIS — Z01818 Encounter for other preprocedural examination: Secondary | ICD-10-CM

## 2023-04-12 DIAGNOSIS — R229 Localized swelling, mass and lump, unspecified: Secondary | ICD-10-CM | POA: Diagnosis present

## 2023-04-12 DIAGNOSIS — L7211 Pilar cyst: Secondary | ICD-10-CM | POA: Insufficient documentation

## 2023-04-12 DIAGNOSIS — I10 Essential (primary) hypertension: Secondary | ICD-10-CM | POA: Diagnosis not present

## 2023-04-12 DIAGNOSIS — K219 Gastro-esophageal reflux disease without esophagitis: Secondary | ICD-10-CM | POA: Diagnosis not present

## 2023-04-12 DIAGNOSIS — L7212 Trichodermal cyst: Secondary | ICD-10-CM | POA: Diagnosis not present

## 2023-04-12 DIAGNOSIS — L723 Sebaceous cyst: Secondary | ICD-10-CM | POA: Insufficient documentation

## 2023-04-12 HISTORY — PX: LESION EXCISION: SHX5167

## 2023-04-12 LAB — CBC
HCT: 42.7 % (ref 36.0–46.0)
Hemoglobin: 13.5 g/dL (ref 12.0–15.0)
MCH: 29.6 pg (ref 26.0–34.0)
MCHC: 31.6 g/dL (ref 30.0–36.0)
MCV: 93.6 fL (ref 80.0–100.0)
Platelets: 269 10*3/uL (ref 150–400)
RBC: 4.56 MIL/uL (ref 3.87–5.11)
RDW: 13.6 % (ref 11.5–15.5)
WBC: 6.7 10*3/uL (ref 4.0–10.5)
nRBC: 0 % (ref 0.0–0.2)

## 2023-04-12 LAB — BASIC METABOLIC PANEL
Anion gap: 9 (ref 5–15)
BUN: 15 mg/dL (ref 6–20)
CO2: 20 mmol/L — ABNORMAL LOW (ref 22–32)
Calcium: 9 mg/dL (ref 8.9–10.3)
Chloride: 107 mmol/L (ref 98–111)
Creatinine, Ser: 0.73 mg/dL (ref 0.44–1.00)
GFR, Estimated: >60 mL/min (ref >60)
Glucose, Bld: 96 mg/dL (ref 70–99)
Potassium: 3.9 mmol/L (ref 3.5–5.1)
Sodium: 136 mmol/L (ref 135–145)

## 2023-04-12 LAB — POCT PREGNANCY, URINE: Preg Test, Ur: NEGATIVE

## 2023-04-12 SURGERY — EXCISION, LESION, SCALP
Anesthesia: Monitor Anesthesia Care

## 2023-04-12 MED ORDER — 0.9 % SODIUM CHLORIDE (POUR BTL) OPTIME
TOPICAL | Status: DC | PRN
  Administered 2023-04-12: 1000 mL

## 2023-04-12 MED ORDER — CHLORHEXIDINE GLUCONATE 0.12 % MT SOLN
15.0000 mL | Freq: Once | OROMUCOSAL | Status: AC
  Administered 2023-04-12: 15 mL via OROMUCOSAL

## 2023-04-12 MED ORDER — ACETAMINOPHEN 500 MG PO TABS
1000.0000 mg | ORAL_TABLET | ORAL | Status: AC
  Administered 2023-04-12: 1000 mg via ORAL
  Filled 2023-04-12: qty 2

## 2023-04-12 MED ORDER — CHLORHEXIDINE GLUCONATE 4 % EX SOLN: 60.0000 mL | Freq: Once | CUTANEOUS | Status: DC

## 2023-04-12 MED ORDER — KETOROLAC TROMETHAMINE 30 MG/ML IJ SOLN: 30.0000 mg | Freq: Once | INTRAMUSCULAR | Status: DC | PRN

## 2023-04-12 MED ORDER — MIDAZOLAM HCL 5 MG/5ML IJ SOLN
INTRAMUSCULAR | Status: DC | PRN
Start: 2023-04-12 — End: 2023-04-12
  Administered 2023-04-12 (×2): 1 mg via INTRAVENOUS

## 2023-04-12 MED ORDER — PROPOFOL 10 MG/ML IV BOLUS
INTRAVENOUS | Status: DC | PRN
  Administered 2023-04-12 (×2): 10 mg via INTRAVENOUS

## 2023-04-12 MED ORDER — MIDAZOLAM HCL 2 MG/2ML IJ SOLN
INTRAMUSCULAR | Status: AC
  Filled 2023-04-12: qty 2

## 2023-04-12 MED ORDER — AMISULPRIDE (ANTIEMETIC) 5 MG/2ML IV SOLN: 10.0000 mg | Freq: Once | INTRAVENOUS | Status: DC | PRN

## 2023-04-12 MED ORDER — KETAMINE HCL 50 MG/5ML IJ SOSY
PREFILLED_SYRINGE | INTRAMUSCULAR | Status: AC
Start: 2023-04-12 — End: ?
  Filled 2023-04-12: qty 5

## 2023-04-12 MED ORDER — DOCUSATE SODIUM 100 MG PO CAPS: 100.0000 mg | ORAL_CAPSULE | Freq: Two times a day (BID) | ORAL | 0 refills | Status: AC

## 2023-04-12 MED ORDER — LIDOCAINE HCL 1 % IJ SOLN
INTRAMUSCULAR | Status: DC | PRN
  Administered 2023-04-12: 40 mg via INTRADERMAL
  Administered 2023-04-12: 20 mg via INTRADERMAL

## 2023-04-12 MED ORDER — TRAMADOL HCL 50 MG PO TABS
50.0000 mg | ORAL_TABLET | Freq: Four times a day (QID) | ORAL | 0 refills | Status: AC | PRN
Start: 2023-04-12 — End: 2023-04-17

## 2023-04-12 MED ORDER — OXYCODONE HCL 5 MG/5ML PO SOLN: 5.0000 mg | Freq: Once | ORAL | Status: DC | PRN

## 2023-04-12 MED ORDER — CEFAZOLIN SODIUM-DEXTROSE 2-4 GM/100ML-% IV SOLN
2.0000 g | INTRAVENOUS | Status: AC
  Administered 2023-04-12: 2 g via INTRAVENOUS
  Filled 2023-04-12: qty 100

## 2023-04-12 MED ORDER — BUPIVACAINE-EPINEPHRINE 0.25% -1:200000 IJ SOLN
INTRAMUSCULAR | Status: DC | PRN
  Administered 2023-04-12: 30 mL

## 2023-04-12 MED ORDER — LACTATED RINGERS IV SOLN: INTRAVENOUS | Status: DC

## 2023-04-12 MED ORDER — ORAL CARE MOUTH RINSE: 15.0000 mL | Freq: Once | OROMUCOSAL | Status: AC

## 2023-04-12 MED ORDER — PROPOFOL 500 MG/50ML IV EMUL
INTRAVENOUS | Status: DC | PRN
  Administered 2023-04-12: 50 ug/kg/min via INTRAVENOUS

## 2023-04-12 MED ORDER — PROPOFOL 10 MG/ML IV BOLUS
INTRAVENOUS | Status: AC
  Filled 2023-04-12: qty 20

## 2023-04-12 MED ORDER — FENTANYL CITRATE (PF) 100 MCG/2ML IJ SOLN
INTRAMUSCULAR | Status: DC | PRN
  Administered 2023-04-12: 50 ug via INTRAVENOUS
  Administered 2023-04-12: 25 ug via INTRAVENOUS

## 2023-04-12 MED ORDER — DEXAMETHASONE SODIUM PHOSPHATE 10 MG/ML IJ SOLN
INTRAMUSCULAR | Status: AC
  Filled 2023-04-12: qty 1

## 2023-04-12 MED ORDER — DEXMEDETOMIDINE HCL IN NACL 80 MCG/20ML IV SOLN
INTRAVENOUS | Status: DC | PRN
  Administered 2023-04-12: 4 ug via INTRAVENOUS

## 2023-04-12 MED ORDER — BUPIVACAINE-EPINEPHRINE (PF) 0.25% -1:200000 IJ SOLN
INTRAMUSCULAR | Status: AC
  Filled 2023-04-12: qty 30

## 2023-04-12 MED ORDER — FENTANYL CITRATE PF 50 MCG/ML IJ SOSY: 25.0000 ug | PREFILLED_SYRINGE | INTRAMUSCULAR | Status: DC | PRN

## 2023-04-12 MED ORDER — FENTANYL CITRATE (PF) 100 MCG/2ML IJ SOLN
INTRAMUSCULAR | Status: AC
  Filled 2023-04-12: qty 2

## 2023-04-12 MED ORDER — OXYCODONE HCL 5 MG PO TABS: 5.0000 mg | ORAL_TABLET | Freq: Once | ORAL | Status: DC | PRN

## 2023-04-12 MED ORDER — KETAMINE HCL 10 MG/ML IJ SOLN
INTRAMUSCULAR | Status: DC | PRN
  Administered 2023-04-12: 20 mg via INTRAVENOUS

## 2023-04-12 SURGICAL SUPPLY — 24 items
BAG COUNTER SPONGE SURGICOUNT (BAG) IMPLANT
COVER SURGICAL LIGHT HANDLE (MISCELLANEOUS) ×2 IMPLANT
DERMABOND ADVANCED .7 DNX12 (GAUZE/BANDAGES/DRESSINGS) IMPLANT
DRAPE LAPAROSCOPIC ABDOMINAL (DRAPES) IMPLANT
DRAPE LAPAROTOMY TRNSV 102X78 (DRAPES) IMPLANT
DRAPE UTILITY XL STRL (DRAPES) ×2 IMPLANT
ELECT REM PT RETURN 15FT ADLT (MISCELLANEOUS) ×2 IMPLANT
GAUZE SPONGE 4X4 12PLY STRL (GAUZE/BANDAGES/DRESSINGS) ×2 IMPLANT
GLOVE BIO SURGEON STRL SZ 6 (GLOVE) ×2 IMPLANT
GLOVE INDICATOR 6.5 STRL GRN (GLOVE) ×2 IMPLANT
GOWN STRL REUS W/ TWL LRG LVL3 (GOWN DISPOSABLE) ×2 IMPLANT
KIT BASIN OR (CUSTOM PROCEDURE TRAY) ×2 IMPLANT
KIT TURNOVER KIT A (KITS) IMPLANT
MARKER SKIN DUAL TIP RULER LAB (MISCELLANEOUS) IMPLANT
NDL HYPO 22X1.5 SAFETY MO (MISCELLANEOUS) ×2 IMPLANT
NEEDLE HYPO 22X1.5 SAFETY MO (MISCELLANEOUS) ×1 IMPLANT
PACK GENERAL/GYN (CUSTOM PROCEDURE TRAY) ×2 IMPLANT
SPIKE FLUID TRANSFER (MISCELLANEOUS) IMPLANT
SUT MNCRL AB 4-0 PS2 18 (SUTURE) ×2 IMPLANT
SUT PROLENE 4 0 PS 2 18 (SUTURE) IMPLANT
SUT VIC AB 3-0 SH 27X BRD (SUTURE) IMPLANT
SUT VIC AB 3-0 SH 27XBRD (SUTURE) ×2 IMPLANT
SYR CONTROL 10ML LL (SYRINGE) ×2 IMPLANT
TOWEL OR 17X26 10 PK STRL BLUE (TOWEL DISPOSABLE) ×2 IMPLANT

## 2023-04-12 NOTE — Interval H&P Note (Signed)
 History and Physical Interval Note:  04/12/2023 8:29 AM  Michelle Pineda  has presented today for surgery, with the diagnosis of SCALP CYSTS.  The various methods of treatment have been discussed with the patient and family. After consideration of risks, benefits and other options for treatment, the patient has consented to  Procedure(s) with comments: EXCISION, LESION, SCALP (N/A) - EXCSION SCALP CYST  11421 X1 EXCISION SCALP CYST 11420 X2 as a surgical intervention.  The patient's history has been reviewed, patient examined, no change in status, stable for surgery.  I have reviewed the patient's chart and labs.  Questions were answered to the patient's satisfaction.     [name] Lollie Sails

## 2023-04-12 NOTE — Anesthesia Postprocedure Evaluation (Signed)
 Anesthesia Post Note  Patient: Michelle Pineda  Procedure(s) Performed: EXCISION, LESION, SCALP     Patient location during evaluation: PACU Anesthesia Type: MAC Level of consciousness: awake Pain management: pain level controlled Vital Signs Assessment: post-procedure vital signs reviewed and stable Respiratory status: spontaneous breathing, nonlabored ventilation and respiratory function stable Cardiovascular status: blood pressure returned to baseline and stable Postop Assessment: no apparent nausea or vomiting Anesthetic complications: no   No notable events documented.  Last Vitals:  Vitals:   04/12/23 1145 04/12/23 1155  BP: (!) 144/84 139/79  Pulse: 70 65  Resp: 15 18  Temp:  36.4 C  SpO2: 100% 100%    Last Pain:  Vitals:   04/12/23 1155  TempSrc: (P) Oral  PainSc: 0-No pain                 Claressa Hughley P Icyss Skog

## 2023-04-12 NOTE — Anesthesia Preprocedure Evaluation (Signed)
 Anesthesia Evaluation  Patient identified by MRN, date of birth, ID band Patient awake    Reviewed: Allergy & Precautions, NPO status , Patient's Chart, lab work & pertinent test results  Airway Mallampati: II  TM Distance: >3 FB Neck ROM: Full    Dental no notable dental hx.    Pulmonary neg pulmonary ROS   Pulmonary exam normal        Cardiovascular hypertension, Pt. on medications Normal cardiovascular exam     Neuro/Psych negative neurological ROS  negative psych ROS   GI/Hepatic Neg liver ROS,GERD  Medicated and Controlled,,  Endo/Other  negative endocrine ROS    Renal/GU negative Renal ROS     Musculoskeletal  (+) Arthritis ,    Abdominal  (+) + obese  Peds  Hematology negative hematology ROS (+)   Anesthesia Other Findings SCALP CYSTS  Reproductive/Obstetrics Hcg negative                             Anesthesia Physical Anesthesia Plan  ASA: 2  Anesthesia Plan: MAC   Post-op Pain Management:    Induction: Intravenous  PONV Risk Score and Plan: 2 and Ondansetron, Dexamethasone, Propofol infusion, Midazolam and Treatment may vary due to age or medical condition  Airway Management Planned: Nasal Cannula  Additional Equipment:   Intra-op Plan:   Post-operative Plan:   Informed Consent: I have reviewed the patients History and Physical, chart, labs and discussed the procedure including the risks, benefits and alternatives for the proposed anesthesia with the patient or authorized representative who has indicated his/her understanding and acceptance.     Dental advisory given  Plan Discussed with: CRNA  Anesthesia Plan Comments:        Anesthesia Quick Evaluation

## 2023-04-12 NOTE — Transfer of Care (Signed)
 Immediate Anesthesia Transfer of Care Note  Patient: Michelle Pineda  Procedure(s) Performed: EXCISION, LESION, SCALP  Patient Location: PACU  Anesthesia Type:MAC  Level of Consciousness: awake, alert , oriented, and patient cooperative  Airway & Oxygen Therapy: Patient Spontanous Breathing and Patient connected to face mask oxygen  Post-op Assessment: Report given to RN and Post -op Vital signs reviewed and stable  Post vital signs: Reviewed and stable  Last Vitals:  Vitals Value Taken Time  BP 151/90 04/12/23 1119  Temp    Pulse 64 04/12/23 1123  Resp 18 04/12/23 1123  SpO2 100 % 04/12/23 1123  Vitals shown include unfiled device data.  Last Pain:  Vitals:   04/12/23 0843  TempSrc:   PainSc: 0-No pain         Complications: No notable events documented.

## 2023-04-12 NOTE — Discharge Instructions (Addendum)
 GENERAL SURGERY: POST OP INSTRUCTIONS  EAT Gradually transition to a high fiber diet with a fiber supplement over the next few weeks after discharge.  Start with a pureed / full liquid diet (see below)  WALK Walk an hour a day (cumulative, not all at once).  Control your pain to do that.    CONTROL PAIN Control pain so that you can walk, sleep, tolerate sneezing/coughing, go up/down stairs.  HAVE A BOWEL MOVEMENT DAILY Keep your bowels regular to avoid problems.  OK to try a laxative to override constipation.  OK to use an antidairrheal to slow down diarrhea.  Call if not better after 2 tries  CALL IF YOU HAVE PROBLEMS/CONCERNS Call if you are still struggling despite following these instructions. Call if you have concerns not answered by these instructions    DIET: Follow a light bland diet & liquids the first 24 hours after arrival home, such as soup, liquids, starches, etc.  Be sure to drink plenty of fluids.  Quickly advance to a usual solid diet within a few days.  Avoid fast food or heavy meals as your are more likely to get nauseated or have irregular bowels.  A low-sugar, high-fiber diet for the rest of your life is ideal.   Take your usually prescribed home medications unless otherwise directed. PAIN CONTROL: Pain is best controlled by a usual combination of three different methods TOGETHER: Ice/Heat Over the counter pain medication Prescription pain medication Most patients will experience some swelling and bruising around the incisions.  Ice packs or heating pads (30-60 minutes up to 6 times a day) will help. Use ice for the first few days to help decrease swelling and bruising, then switch to heat to help relax tight/sore spots and speed recovery.  Some people prefer to use ice alone, heat alone, alternating between ice & heat.  Experiment to what works for you.  Swelling and bruising can take several weeks to resolve.   It is helpful to take an over-the-counter pain  medication regularly for the first few weeks.  Choose one of the following that works best for you: Naproxen (Aleve, etc)  Two 220mg  tabs twice a day OR Ibuprofen (Advil, etc) Three 200mg  tabs four times a day (every meal & bedtime) AND Acetaminophen (Tylenol, etc) 500-650mg  four times a day (every meal & bedtime) A  prescription for pain medication (tramadol) was sent to your pharmacy.  Take your pain medication as prescribed, IF NEEDED.  If you are having problems/concerns with the prescription medicine (does not control pain, nausea, vomiting, rash, itching, etc), please call us [phone] to see if we need to switch you to a different pain medicine that will work better for you and/or control your side effect better. If you need a refill on your pain medication, please contact your pharmacy.  They will contact our office to request authorization. Prescriptions will not be filled after 5 pm or on week-ends. Avoid getting constipated.  Between the surgery and the pain medications, it is common to experience some constipation.  Increasing fluid intake and taking a fiber supplement (such as Metamucil, Citrucel, FiberCon, MiraLax, etc) 1-2 times a day regularly will usually help prevent this problem from occurring.  A mild laxative (prune juice, Milk of Magnesia, MiraLax, etc) should be taken according to package directions if there are no bowel movements after 48 hours.   Wash / shower every day, starting 2 days after surgery.  Let the soap and water run over the incisions  and then pat dry. Remove your outer bandages 2 days after surgery.  You may leave the incisions open to air.   You may replace a dressing/Band-Aid to cover the incision for comfort if you wish.   ACTIVITIES as tolerated:   You may resume regular (light) daily activities beginning the next day--such as daily self-care, walking, climbing stairs--gradually increasing activities as tolerated.  If you can walk 30 minutes without  difficulty, it is safe to try more intense activity such as jogging, treadmill, bicycling, low-impact aerobics, swimming, etc. Save the most intensive and strenuous activity for last such as sit-ups, heavy lifting, contact sports, etc  Refrain from any heavy lifting or straining until you are off narcotics for pain control.   DO NOT PUSH THROUGH PAIN.  Let pain be your guide: If it hurts to do something, don't do it.  Pain is your body warning you to avoid that activity for another week until the pain goes down. You may drive when you are no longer taking prescription pain medication, you can comfortably wear a seatbelt, and you can safely maneuver your car and apply brakes. You may have sexual intercourse when it is comfortable.  FOLLOW UP in our office Please call [facility] at [phone] to set up an appointment to see your surgeon in the office for a follow-up appointment approximately 2-3 weeks after your surgery.  We will schedule an appointment for suture removal in 10 to 14 days. Make sure that you call for this appointment the day you arrive home to insure a convenient appointment time. 9. IF YOU HAVE DISABILITY OR FAMILY LEAVE FORMS, BRING THEM TO THE OFFICE FOR PROCESSING.  DO NOT GIVE THEM TO YOUR DOCTOR.   WHEN TO CALL us [phone]: Poor pain control Reactions / problems with new medications (rash/itching, nausea, etc)  Fever over 101.5 F (38.5 C) Worsening swelling or bruising Continued bleeding from incision. Increased pain, redness, or drainage from the incision Difficulty breathing / swallowing   The clinic staff is available to answer your questions during regular business hours (8:30am-5pm).  Please don't hesitate to call and ask to speak to one of our nurses for clinical concerns.   If you have a medical emergency, go to the nearest emergency room or call 911.  A surgeon from [facility] is always on call at the [facility],  [place] [address], [place], [place]  [address] ? MAIN: [phone] ? TOLL FREE: [phone] ?  FAX [phone] [email or web address]

## 2023-04-12 NOTE — Op Note (Signed)
 Operative Note  Michelle Pineda  [id number]  [id number]  04/12/2023   Surgeon: Phylliss Blakes MD FACS  Procedure performed: Excision of subcutaneous scalp cyst x 3, size ranging from 5 mm to 1 cm   Preop diagnosis: 3 scalp cysts Post-op diagnosis/intraop findings: Same   Specimens: Scalp cysts Retained items: no  EBL: Minimal cc Complications: none   Description of procedure: After confirming informed consent the patient was taken to the operating room and placed supine on operating room table where MAC was initiated, preoperative antibiotics were administered, SCDs applied, and a formal timeout was performed.  The area overlying the 3 lesions on the occiput was clipped, prepped and draped in usual sterile fashion.  After infiltration with quarter percent Marcaine with epinephrine, an elliptical incision was made over each of the 3 lesions and then cautery used to dissect through the soft tissues, excising each cyst intact.  Hemostasis was ensured with cautery.  On the larger of the 3 incisions (to the patient's right) a deep dermal 3-0 Vicryl was placed.  All skin incisions were then closed with interrupted 3-0 Prolene's -a total of 6 sutures.  Dry gauze dressings were then applied.  The patient was then awakened and taken to PACU in stable condition.    All counts were correct at the completion of the case.

## 2023-04-13 ENCOUNTER — Encounter (HOSPITAL_COMMUNITY): Payer: Self-pay | Admitting: Surgery

## 2023-04-15 LAB — SURGICAL PATHOLOGY

## 2023-06-13 ENCOUNTER — Emergency Department (HOSPITAL_BASED_OUTPATIENT_CLINIC_OR_DEPARTMENT_OTHER)
Admission: EM | Admit: 2023-06-13 | Discharge: 2023-06-13 | Disposition: A | Discharge status: Home / Self Care | Attending: Emergency Medicine | Admitting: Emergency Medicine

## 2023-06-13 ENCOUNTER — Other Ambulatory Visit: Payer: Self-pay

## 2023-06-13 DIAGNOSIS — S199XXA Unspecified injury of neck, initial encounter: Secondary | ICD-10-CM | POA: Diagnosis present

## 2023-06-13 DIAGNOSIS — Y92410 Unspecified street and highway as the place of occurrence of the external cause: Secondary | ICD-10-CM | POA: Insufficient documentation

## 2023-06-13 DIAGNOSIS — S161XXA Strain of muscle, fascia and tendon at neck level, initial encounter: Secondary | ICD-10-CM | POA: Diagnosis not present

## 2023-06-13 MED ORDER — KETOROLAC TROMETHAMINE 15 MG/ML IJ SOLN
15.0000 mg | Freq: Once | INTRAMUSCULAR | Status: AC
Start: 2023-06-13 — End: 2023-06-13
  Administered 2023-06-13: 15 mg via INTRAMUSCULAR

## 2023-06-13 MED ORDER — KETOROLAC TROMETHAMINE 15 MG/ML IJ SOLN
15.0000 mg | Freq: Once | INTRAMUSCULAR | Status: DC
  Filled 2023-06-13: qty 1

## 2023-06-13 MED ORDER — OXYCODONE-ACETAMINOPHEN 5-325 MG PO TABS
1.0000 | ORAL_TABLET | Freq: Once | ORAL | Status: AC
  Administered 2023-06-13: 1 via ORAL
  Filled 2023-06-13: qty 1

## 2023-06-13 MED ORDER — LIDOCAINE 5 % EX PTCH: 1.0000 | MEDICATED_PATCH | CUTANEOUS | 0 refills | Status: AC
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
## 2023-06-13 NOTE — Discharge Instructions (Signed)
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
